# Patient Record
Sex: Male | Born: 1980 | Race: White | Hispanic: Yes | Marital: Married | Smoking: Never smoker
Health system: Southern US, Community
[De-identification: ages and names within clinical notes are randomized; demographics above are authoritative.]

---

## 2010-07-17 ENCOUNTER — Emergency Department (HOSPITAL_BASED_OUTPATIENT_CLINIC_OR_DEPARTMENT_OTHER)
Admission: EM | Admit: 2010-07-17 | Discharge: 2010-07-17 | Payer: Self-pay | Source: Home / Self Care | Admitting: Emergency Medicine

## 2010-07-18 LAB — BASIC METABOLIC PANEL
BUN: 17 mg/dL (ref 6–23)
CO2: 24 mEq/L (ref 19–32)
Calcium: 8.6 mg/dL (ref 8.4–10.5)
Chloride: 105 mEq/L (ref 96–112)
Creatinine, Ser: 0.9 mg/dL (ref 0.4–1.5)
GFR calc Af Amer: >60 mL/min (ref >60)
GFR calc non Af Amer: >60 mL/min (ref >60)
Glucose, Bld: 112 mg/dL — ABNORMAL HIGH (ref 70–99)
Potassium: 4.4 mEq/L (ref 3.5–5.1)
Sodium: 144 mEq/L (ref 135–145)

## 2010-09-12 ENCOUNTER — Emergency Department (INDEPENDENT_AMBULATORY_CARE_PROVIDER_SITE_OTHER): Payer: Self-pay

## 2010-09-12 ENCOUNTER — Emergency Department (HOSPITAL_BASED_OUTPATIENT_CLINIC_OR_DEPARTMENT_OTHER)
Admission: EM | Admit: 2010-09-12 | Discharge: 2010-09-12 | Disposition: A | Discharge status: Home / Self Care | Payer: Self-pay | Attending: Emergency Medicine | Admitting: Emergency Medicine

## 2010-09-12 DIAGNOSIS — R509 Fever, unspecified: Secondary | ICD-10-CM | POA: Insufficient documentation

## 2010-09-12 DIAGNOSIS — R059 Cough, unspecified: Secondary | ICD-10-CM | POA: Insufficient documentation

## 2010-09-12 DIAGNOSIS — R05 Cough: Secondary | ICD-10-CM

## 2010-09-12 DIAGNOSIS — J069 Acute upper respiratory infection, unspecified: Secondary | ICD-10-CM | POA: Insufficient documentation

## 2010-09-12 LAB — RAPID STREP SCREEN (MED CTR MEBANE ONLY): Streptococcus, Group A Screen (Direct): NEGATIVE

## 2011-07-27 IMAGING — CR DG CHEST 2V
2 series · 2 of 2 positions shown · non-contrast
Comparison: 07/17/2010

CLINICAL DATA: Productive cough.  Fever.

CHEST - 2 VIEW

[w chest pa]
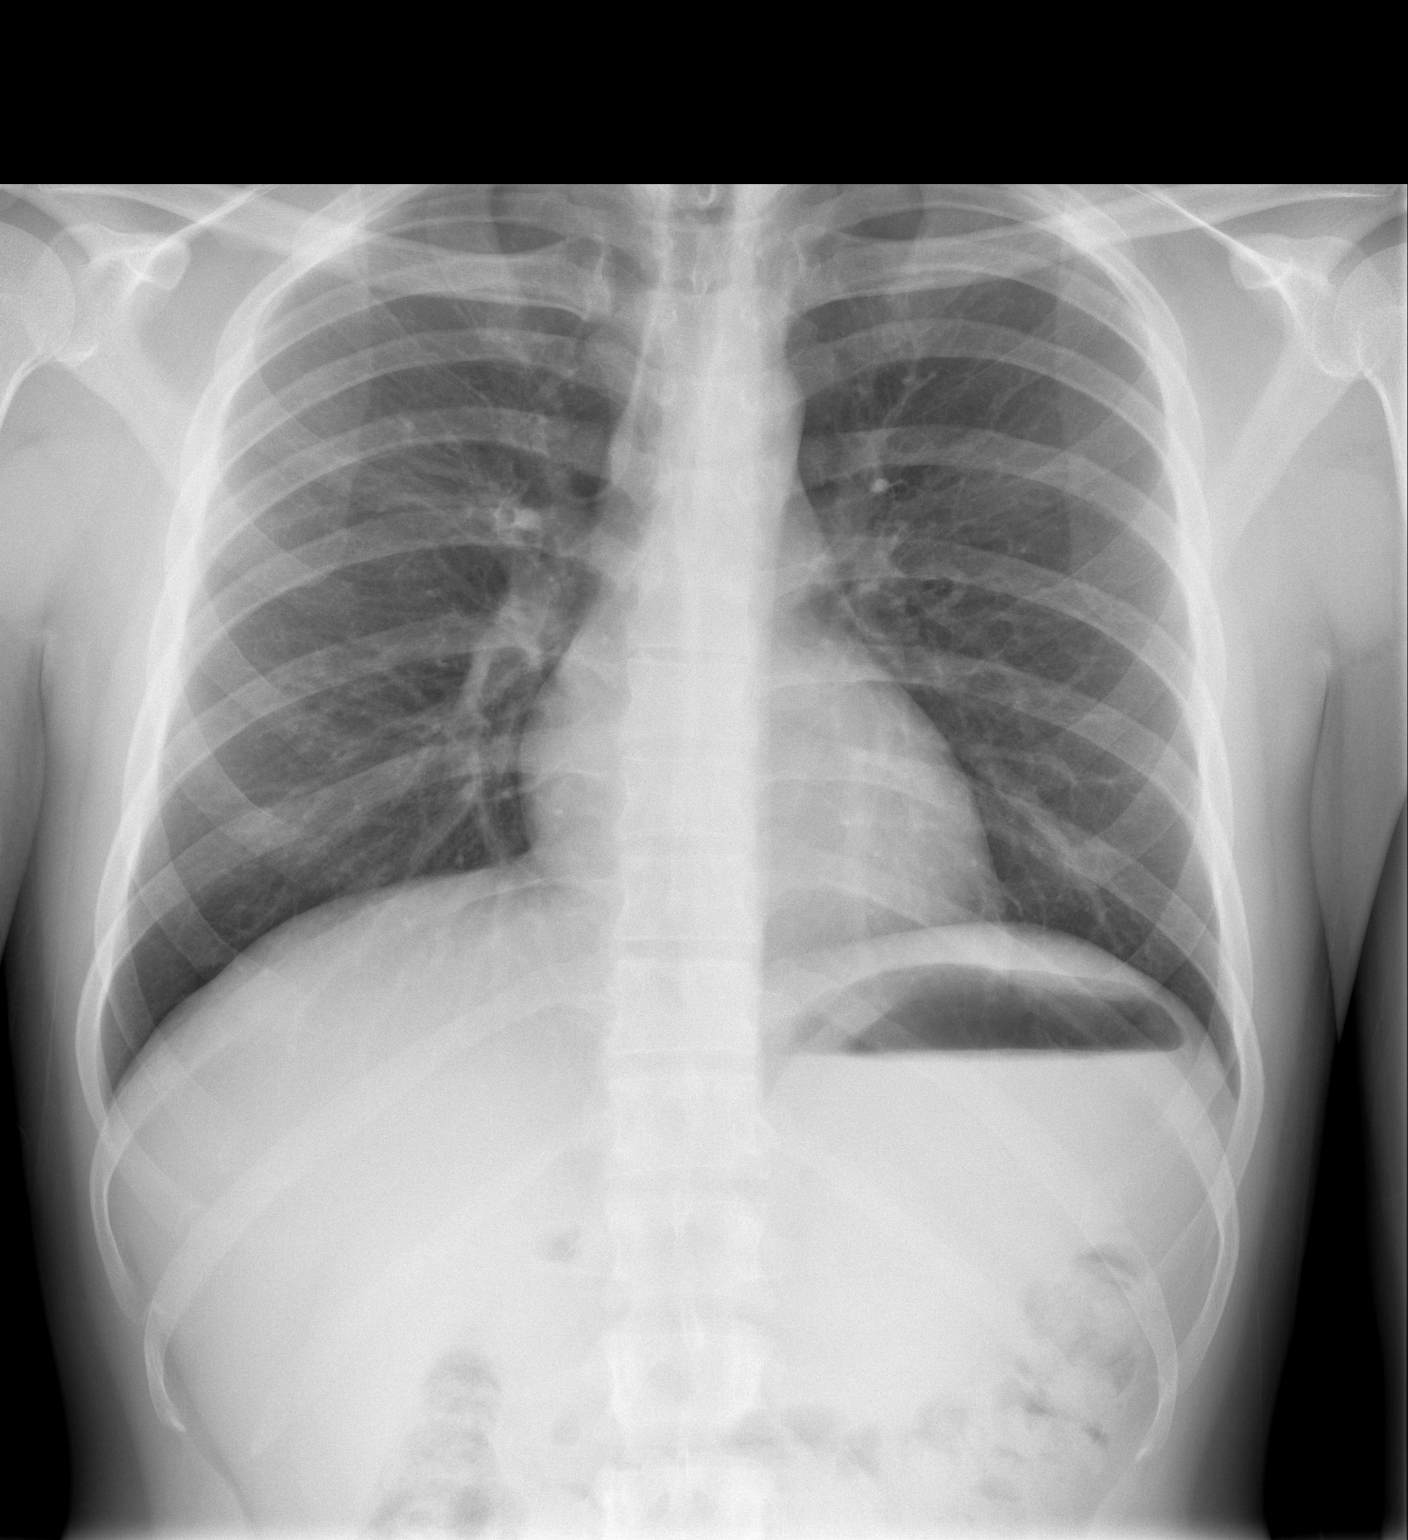

[w chest lat]
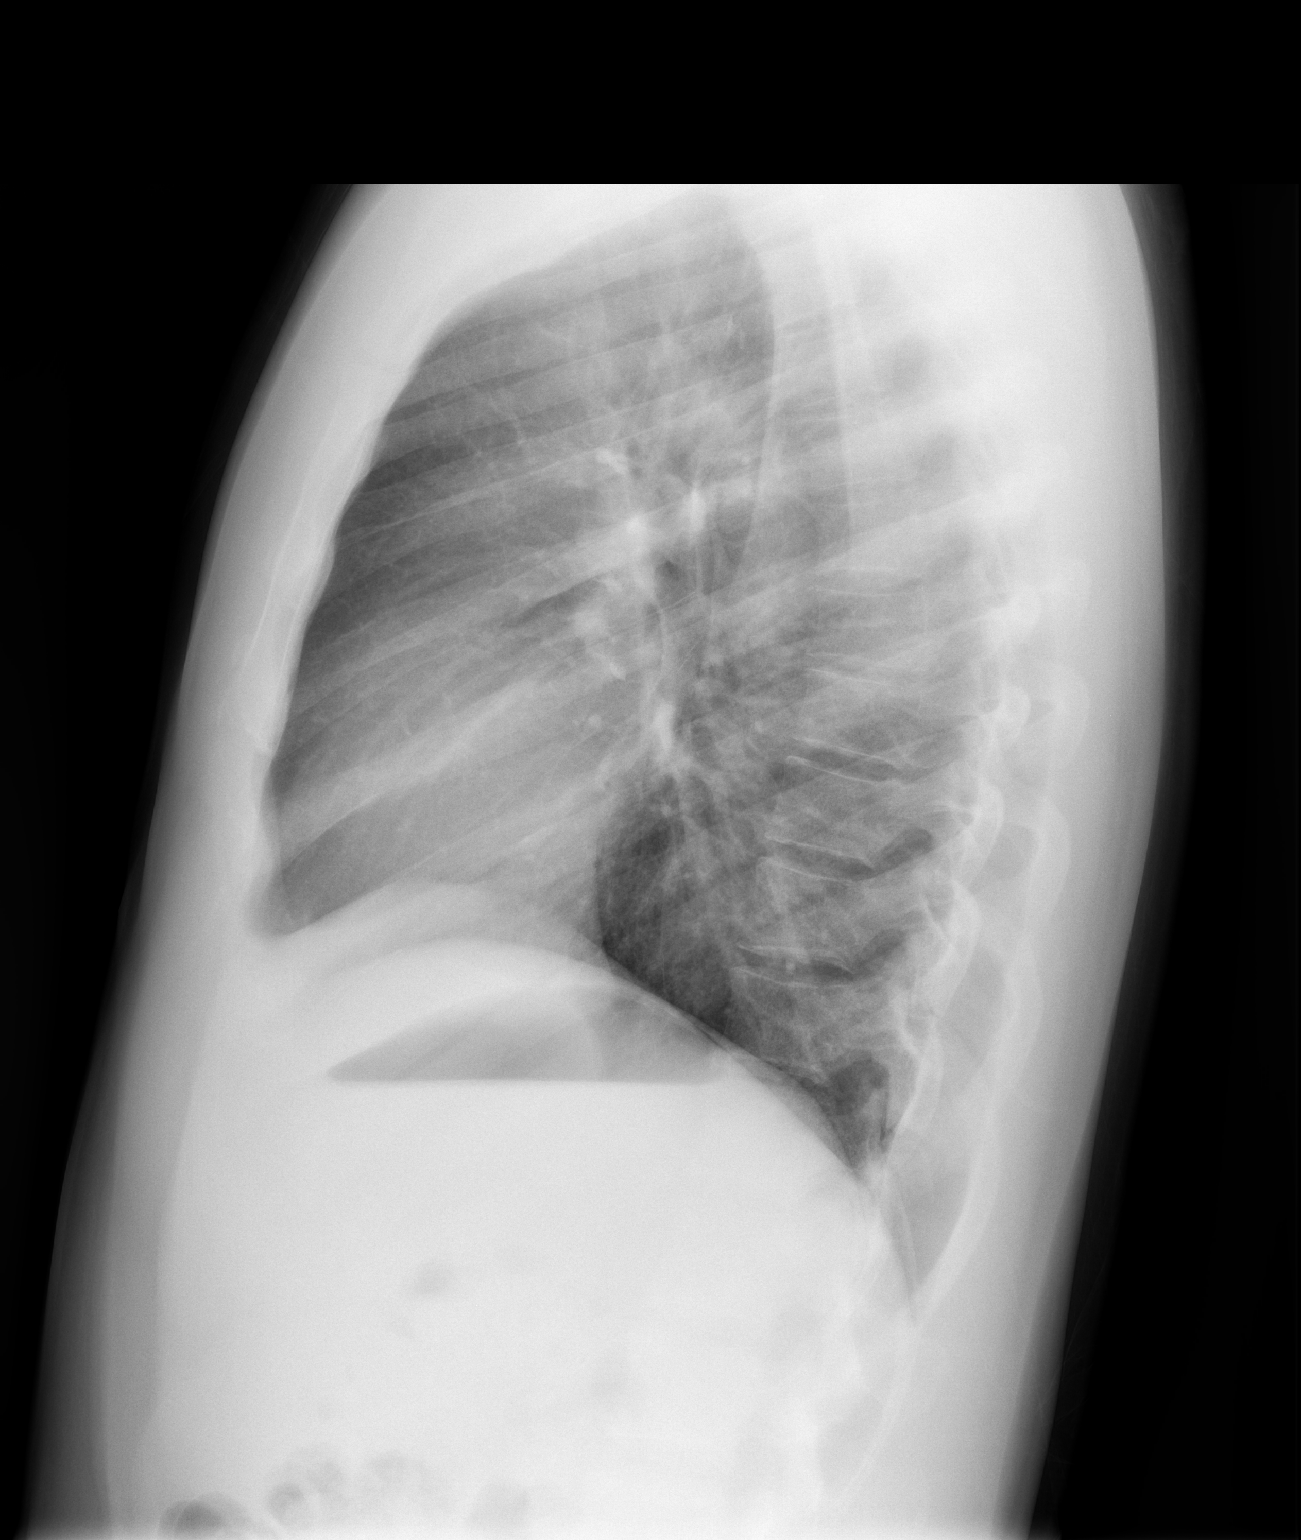

[2 of 2 positions shown; findings below may reference images not displayed]

FINDINGS: The heart size and vascularity are normal and the lungs
are clear.  No osseous abnormality.  There has been complete
clearing of the right lower lobe pneumonia seen on the prior exam.
IMPRESSION: Normal chest.

## 2014-06-29 ENCOUNTER — Encounter (HOSPITAL_BASED_OUTPATIENT_CLINIC_OR_DEPARTMENT_OTHER): Payer: Self-pay

## 2014-06-29 ENCOUNTER — Emergency Department (HOSPITAL_BASED_OUTPATIENT_CLINIC_OR_DEPARTMENT_OTHER)
Admission: EM | Admit: 2014-06-29 | Discharge: 2014-06-30 | Disposition: A | Discharge status: Home / Self Care | Payer: Self-pay | Attending: Emergency Medicine | Admitting: Emergency Medicine

## 2014-06-29 ENCOUNTER — Emergency Department (HOSPITAL_BASED_OUTPATIENT_CLINIC_OR_DEPARTMENT_OTHER): Payer: Self-pay

## 2014-06-29 DIAGNOSIS — M545 Low back pain: Secondary | ICD-10-CM | POA: Insufficient documentation

## 2014-06-29 DIAGNOSIS — M542 Cervicalgia: Secondary | ICD-10-CM | POA: Insufficient documentation
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
## 2014-06-29 NOTE — ED Notes (Signed)
Pa  at bedside. 

## 2014-06-30 MED ORDER — HYDROCODONE-ACETAMINOPHEN 5-325 MG PO TABS: 1.0000 | ORAL_TABLET | ORAL | Status: DC | PRN

## 2014-06-30 MED ORDER — CYCLOBENZAPRINE HCL 10 MG PO TABS: 10.0000 mg | ORAL_TABLET | Freq: Two times a day (BID) | ORAL | Status: DC | PRN

## 2014-06-30 MED ORDER — IBUPROFEN 800 MG PO TABS: 800.0000 mg | ORAL_TABLET | Freq: Once | ORAL | Status: DC

## 2014-06-30 NOTE — Discharge Instructions (Signed)
Cervical Radiculopathy Cervical radiculopathy happens when a nerve in the neck is pinched or bruised by a slipped (herniated) disk or by arthritic changes in the bones of the cervical spine. This can occur due to an injury or as part of the normal aging process. Pressure on the cervical nerves can cause pain or numbness that runs from your neck all the way down into your arm and fingers. CAUSES  There are many possible causes, including:  Injury.  Muscle tightness in the neck from overuse.  Swollen, painful joints (arthritis).  Breakdown or degeneration in the bones and joints of the spine (spondylosis) due to aging.  Bone spurs that may develop near the cervical nerves. SYMPTOMS  Symptoms include pain, weakness, or numbness in the affected arm and hand. Pain can be severe or irritating. Symptoms may be worse when extending or turning the neck. DIAGNOSIS  Your caregiver will ask about your symptoms and do a physical exam. He or she may test your strength and reflexes. X-rays, CT scans, and MRI scans may be needed in cases of injury or if the symptoms do not go away after a period of time. Electromyography (EMG) or nerve conduction testing may be done to study how your nerves and muscles are working. TREATMENT  Your caregiver may recommend certain exercises to help relieve your symptoms. Cervical radiculopathy can, and often does, get better with time and treatment. If your problems continue, treatment options may include:  Wearing a soft collar for short periods of time.  Physical therapy to strengthen the neck muscles.  Medicines, such as nonsteroidal anti-inflammatory drugs (NSAIDs), oral corticosteroids, or spinal injections.  Surgery. Different types of surgery may be done depending on the cause of your problems. HOME CARE INSTRUCTIONS   Put ice on the affected area.  Put ice in a plastic bag.  Place a towel between your skin and the bag.  Leave the ice on for 15-20 minutes,  03-04 times a day or as directed by your caregiver.  If ice does not help, you can try using heat. Take a warm shower or bath, or use a hot water bottle as directed by your caregiver.  You may try a gentle neck and shoulder massage.  Use a flat pillow when you sleep.  Only take over-the-counter or prescription medicines for pain, discomfort, or fever as directed by your caregiver.  If physical therapy was prescribed, follow your caregiver's directions.  If a soft collar was prescribed, use it as directed. SEEK IMMEDIATE MEDICAL CARE IF:   Your pain gets much worse and cannot be controlled with medicines.  You have weakness or numbness in your hand, arm, face, or leg.  You have a high fever or a stiff, rigid neck.  You lose bowel or bladder control (incontinence).  You have trouble with walking, balance, or speaking. MAKE SURE YOU:   Understand these instructions.  Will watch your condition.  Will get help right away if you are not doing well or get worse. Document Released: 03/14/2001 Document Revised: 09/11/2011 Document Reviewed: 01/31/2011 [facility] Patient Information 2015 [place]Care, [place]. This information is not intended to replace advice given to you by your health care provider. Make sure you discuss any questions you have with your health care provider.  Radiculopata cervical (Cervical Radiculopathy)  La radiculopata cervical se produce cuando un nervio del cuello se comprime o es desplazado un disco herniado o por cambios artrticos en los huesos de la columna cervical. Esto puede ocurrir debido a una lesin  o como parte del proceso normal de envejecimiento. La presin United Stationerssobre los nervios cervicales pueden causar dolor o adormecimiento que se extiende desde el cuello hacia los brazos y los dedos.  CAUSAS  Hay numerosas causas que Dole Foodoriginan este problema, entre las que se incluyen:   Traumatismos.  Rigidez de los msculos del cuello por el uso  excesivo.  Articulaciones que duelen y que se hinchan (artritis).  Desgaste o degeneracin de los huesos y las articulaciones de la columna (espondilosis) debido al proceso de envejecimiento.  Espolones seos que pueden desarrollarse cerca de los nervios cervicales. SNTOMAS  Los sntomas son dolor, debilidad o adormecimiento en la mano y en el brazo afectados. El dolor puede ser intenso o irritante. Los sntomas pueden empeorar al extender o torcer el cuello.  DIAGNSTICO  El mdico le preguntar acerca de sus sntomas y le har un examen fsico. Warehouse managerTratar de poner a prueba su fuerza y   sus reflejos. Le indicarn radiografas, una tomografa computada y Health visitorresonancia magntica en caso de traumatismos o si los sntomas no desaparecen despus de cierto perodo de Montebellotiempo. Podrn hacerle una electromiografa (EMG) o una prueba de conduccin nerviosa para estudiar el funcionamiento de sus nervios y msculos.  TRATAMIENTO  El mdico podr recomendar algunos ejercicios para Lyondell Chemicalcalmar los sntomas. La radiculopata puede, y con frecuencia se logra, mejorar con el tiempo y un tratamiento. Si los sntomas continan, las opciones de tratamiento son:  Usar un collar blando durante un tiempo breve.  Fisioterapia para fortalecer los msculos del cuello.  Medicamentos, como los antinflamatorios no esteroideos (AINES), corticoides por va oral o inyecciones en la columna vertebral.  Ciruga. Segn la causa del problema podrn implementarse diferentes tipos de Azerbaijanciruga. INSTRUCCIONES PARA EL CUIDADO DOMICILIARIO  Aplique hielo sobre la zona afectada.  Ponga el hielo en una bolsa plstica.  Colquese una toalla entre la piel y la bolsa de hielo.  Deje el hielo durante 15 a 20 minutos 3 a 4 veces por da, o segn las indicaciones del mdico.  Si el hielo no ayuda, puede Charity fundraiserintentar con calor. Tome una ducha o bao caliente, o use una bolsa de agua caliente segn las indicaciones de su mdico.  Puede intentar con  un masaje suave en el cuello y los hombros.  Por la noche duerma con una almohada plana.  Utilice los medicamentos de venta libre o de prescripcin para Chief Technology Officerel dolor, Environmental health practitionerel malestar o la Winnetoonfiebre, segn se lo indique el profesional que lo asiste.  Si le indican fisioterapia, siga las indicaciones de su mdico.  Si le indican un collar blando, selo segn las indicaciones. SOLICITE ATENCIN MDICA DE INMEDIATO SI:  El dolor empeora mucho y no puede controlarlo con medicamentos.  Siente debilidad o adormecimiento en la mano, el brazo, el rostro o la pierna.  Le sube la fiebre o tiene el cuello rgido.  Pierde el control del intestino o de la vejiga (incontinencia).  Tiene dificultad para caminar, para mantener el equilibrio o para hablar. EST SEGURO QUE:   Comprende estas instrucciones.  Controlar su enfermedad.  Solicitar ayuda de inmediato si no mejora o si empeora. Document Released: 03/29/2005 Document Revised: 09/11/2011 [facility] CenterLLCExitCare Patient Information 2015 [place]ExitCare, [place]LLC. This information is not intended to replace advice given to you by your health care provider. Make sure you discuss any questions you have with your health care provider.

## 2014-07-02 ENCOUNTER — Other Ambulatory Visit (HOSPITAL_BASED_OUTPATIENT_CLINIC_OR_DEPARTMENT_OTHER): Payer: Self-pay | Admitting: Emergency Medicine

## 2014-07-02 DIAGNOSIS — M542 Cervicalgia: Secondary | ICD-10-CM

## 2014-07-08 ENCOUNTER — Emergency Department (HOSPITAL_BASED_OUTPATIENT_CLINIC_OR_DEPARTMENT_OTHER)
Admission: EM | Admit: 2014-07-08 | Discharge: 2014-07-08 | Disposition: A | Discharge status: Home / Self Care | Payer: Self-pay | Attending: Emergency Medicine | Admitting: Emergency Medicine

## 2014-07-08 ENCOUNTER — Encounter (HOSPITAL_BASED_OUTPATIENT_CLINIC_OR_DEPARTMENT_OTHER): Payer: Self-pay | Admitting: *Deleted

## 2014-07-08 ENCOUNTER — Ambulatory Visit (HOSPITAL_BASED_OUTPATIENT_CLINIC_OR_DEPARTMENT_OTHER)
Admission: RE | Admit: 2014-07-08 | Discharge: 2014-07-08 | Disposition: A | Discharge status: Home / Self Care | Payer: Self-pay | Source: Ambulatory Visit | Attending: Emergency Medicine | Admitting: Emergency Medicine

## 2014-07-08 DIAGNOSIS — M5412 Radiculopathy, cervical region: Secondary | ICD-10-CM | POA: Insufficient documentation

## 2014-07-08 DIAGNOSIS — R202 Paresthesia of skin: Secondary | ICD-10-CM | POA: Insufficient documentation

## 2014-07-08 DIAGNOSIS — Z79899 Other long term (current) drug therapy: Secondary | ICD-10-CM | POA: Insufficient documentation

## 2014-07-08 DIAGNOSIS — M542 Cervicalgia: Secondary | ICD-10-CM | POA: Insufficient documentation

## 2014-07-08 MED ORDER — HYDROCODONE-ACETAMINOPHEN 5-325 MG PO TABS: 1.0000 | ORAL_TABLET | ORAL | Status: AC | PRN

## 2014-07-08 MED ORDER — PREDNISONE 20 MG PO TABS: ORAL_TABLET | ORAL | Status: AC

## 2014-07-08 MED ORDER — CYCLOBENZAPRINE HCL 10 MG PO TABS: 10.0000 mg | ORAL_TABLET | Freq: Two times a day (BID) | ORAL | Status: AC | PRN
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
## 2014-07-08 NOTE — ED Notes (Signed)
Pt requesting refill on vicodin and flexeril, MRI done today here

## 2014-07-08 NOTE — Discharge Instructions (Signed)
Please follow up closely with neurosurgeon for further management of your neck pain.  Your neck MRI showing signs of spinal stenosis with potential nerve involvement causing your pain.  Cervical Radiculopathy Cervical radiculopathy happens when a nerve in the neck is pinched or bruised by a slipped (herniated) disk or by arthritic changes in the bones of the cervical spine. This can occur due to an injury or as part of the normal aging process. Pressure on the cervical nerves can cause pain or numbness that runs from your neck all the way down into your arm and fingers. CAUSES  There are many possible causes, including:  Injury.  Muscle tightness in the neck from overuse.  Swollen, painful joints (arthritis).  Breakdown or degeneration in the bones and joints of the spine (spondylosis) due to aging.  Bone spurs that may develop near the cervical nerves. SYMPTOMS  Symptoms include pain, weakness, or numbness in the affected arm and hand. Pain can be severe or irritating. Symptoms may be worse when extending or turning the neck. DIAGNOSIS  Your caregiver will ask about your symptoms and do a physical exam. He or she may test your strength and reflexes. X-rays, CT scans, and MRI scans may be needed in cases of injury or if the symptoms do not go away after a period of time. Electromyography (EMG) or nerve conduction testing may be done to study how your nerves and muscles are working. TREATMENT  Your caregiver may recommend certain exercises to help relieve your symptoms. Cervical radiculopathy can, and often does, get better with time and treatment. If your problems continue, treatment options may include:  Wearing a soft collar for short periods of time.  Physical therapy to strengthen the neck muscles.  Medicines, such as nonsteroidal anti-inflammatory drugs (NSAIDs), oral corticosteroids, or spinal injections.  Surgery. Different types of surgery may be done depending on the cause of  your problems. HOME CARE INSTRUCTIONS   Put ice on the affected area.  Put ice in a plastic bag.  Place a towel between your skin and the bag.  Leave the ice on for 15-20 minutes, 03-04 times a day or as directed by your caregiver.  If ice does not help, you can try using heat. Take a warm shower or bath, or use a hot water bottle as directed by your caregiver.  You may try a gentle neck and shoulder massage.  Use a flat pillow when you sleep.  Only take over-the-counter or prescription medicines for pain, discomfort, or fever as directed by your caregiver.  If physical therapy was prescribed, follow your caregiver's directions.  If a soft collar was prescribed, use it as directed. SEEK IMMEDIATE MEDICAL CARE IF:   Your pain gets much worse and cannot be controlled with medicines.  You have weakness or numbness in your hand, arm, face, or leg.  You have a high fever or a stiff, rigid neck.  You lose bowel or bladder control (incontinence).  You have trouble with walking, balance, or speaking. MAKE SURE YOU:   Understand these instructions.  Will watch your condition.  Will get help right away if you are not doing well or get worse. Document Released: 03/14/2001 Document Revised: 09/11/2011 Document Reviewed: 01/31/2011 [facility] Patient Information 2015 [employer], [place]. This information is not intended to replace advice given to you by your health care provider. Make sure you discuss any questions you have with your health care provider.

## 2014-07-08 NOTE — ED Notes (Signed)
Pt requesting medication refills on vicodin and flexeril.
